# Patient Record
Sex: Female | Born: 1980 | Race: White | Hispanic: No
Health system: Southern US, Community
[De-identification: ages and names within clinical notes are randomized; demographics above are authoritative.]

---

## 2016-07-17 ENCOUNTER — Emergency Department (HOSPITAL_COMMUNITY)
Admission: EM | Admit: 2016-07-17 | Discharge: 2016-07-17 | Disposition: A | Discharge status: Home / Self Care | Payer: Medicaid Other | Attending: Emergency Medicine | Admitting: Emergency Medicine

## 2016-07-17 ENCOUNTER — Emergency Department (HOSPITAL_COMMUNITY): Payer: Medicaid Other

## 2016-07-17 ENCOUNTER — Encounter (HOSPITAL_COMMUNITY): Payer: Self-pay | Admitting: Adult Health

## 2016-07-17 DIAGNOSIS — Y929 Unspecified place or not applicable: Secondary | ICD-10-CM | POA: Diagnosis not present

## 2016-07-17 DIAGNOSIS — Z3A00 Weeks of gestation of pregnancy not specified: Secondary | ICD-10-CM | POA: Diagnosis not present

## 2016-07-17 DIAGNOSIS — Y998 Other external cause status: Secondary | ICD-10-CM | POA: Diagnosis not present

## 2016-07-17 DIAGNOSIS — S99922A Unspecified injury of left foot, initial encounter: Secondary | ICD-10-CM | POA: Diagnosis present

## 2016-07-17 DIAGNOSIS — Y9301 Activity, walking, marching and hiking: Secondary | ICD-10-CM | POA: Insufficient documentation

## 2016-07-17 DIAGNOSIS — F17200 Nicotine dependence, unspecified, uncomplicated: Secondary | ICD-10-CM | POA: Diagnosis not present

## 2016-07-17 DIAGNOSIS — W2203XA Walked into furniture, initial encounter: Secondary | ICD-10-CM | POA: Diagnosis not present

## 2016-07-17 DIAGNOSIS — S92512A Displaced fracture of proximal phalanx of left lesser toe(s), initial encounter for closed fracture: Secondary | ICD-10-CM | POA: Diagnosis not present

## 2016-07-17 MED ORDER — ACETAMINOPHEN 500 MG PO TABS
1000.0000 mg | ORAL_TABLET | Freq: Once | ORAL | Status: AC
  Administered 2016-07-17: 1000 mg via ORAL
  Filled 2016-07-17: qty 2

## 2016-07-17 MED ORDER — LIDOCAINE HCL (PF) 2 % IJ SOLN
2.0000 mL | Freq: Once | INTRAMUSCULAR | Status: AC
  Administered 2016-07-17: 2 mL
  Filled 2016-07-17: qty 10

## 2016-07-17 NOTE — ED Triage Notes (Signed)
Presents with left 5th toe deformity. Occurred this evening while outside in yard, dog got under her and she hit her shin and toe got caught on something. SHe is 3 months pregnant. Denies abdominal pain.

## 2016-07-17 NOTE — Discharge Instructions (Signed)
Wear your post op shoe at all times when awake and walking or standing.  Keep your toe buddy taped to help keep the alignment while this toe heals.  Ice and elevation, tylenol will help with pain.

## 2016-07-18 NOTE — ED Provider Notes (Signed)
[facility]-EMERG[facility]Y DEPT Provider Note   CSN: [id number] Arrival date & time: 07/17/16  2000     History   Chief Complaint Chief Complaint  Patient presents with  . Toe Injury    HPI Amanda Andersen is a 36 y.o. female who is currently 3 months pregnant (to establish care with [facility] in a few weeks) presenting with left 5th toe pain and deformity. She tripped over her small dog (but did not fall) during which time she stubbed her toe on furniture.  The injury occurred just prior to arrival. She also sustained a bruise on her left lower anterior leg, but denies pain at this site.  She has no abdominal pain and no other complaint. She has had no treatment prior to arrival.  The history is provided by the patient and a parent.    History reviewed. No pertinent past medical history.  There are no active problems to display for this patient.   History reviewed. No pertinent surgical history.  OB History    Gravida Para Term Preterm AB Living   1             SAB TAB Ectopic Multiple Live Births                   Home Medications    Prior to Admission medications   Medication Sig Start Date End Date Taking? Authorizing Provider  promethazine (PHENERGAN) 12.5 MG tablet Take 12.5 mg by mouth every 6 (six) hours as needed for nausea or vomiting.   Yes [provider]    Family History History reviewed. No pertinent family history.  Social History Social History  Substance Use Topics  . Smoking status: Current Every Day Smoker  . Smokeless tobacco: Current User  . Alcohol use No     Allergies   Penicillins   Review of Systems Review of Systems  Constitutional: Negative for fever.  Musculoskeletal: Positive for arthralgias and joint swelling. Negative for myalgias.  Skin: Positive for color change. Negative for wound.  Neurological: Negative for weakness, light-headedness and numbness.     Physical Exam Updated Vital Signs BP (!) 96/54 (BP Location:  Right Arm)   Pulse 97   Temp 98.3 F (36.8 C) (Oral)   Resp 20   Ht 5\' 6"  (1.676 m)   LMP  (Approximate)   SpO2 100%   Physical Exam  Constitutional: She appears well-developed and well-nourished.  HENT:  Head: Atraumatic.  Neck: Normal range of motion.  Cardiovascular:  Pulses:      Dorsalis pedis pulses are 2+ on the right side, and 2+ on the left side.  Pulses equal bilaterally  Musculoskeletal: She exhibits tenderness.       Left foot: There is decreased range of motion, bony tenderness and deformity. There is normal capillary refill and no laceration.  Lateral deformity of distal left 5th toe. Bruising noted at toe base.  Distal sensation intact. Bruise left lower anterior pre tib. No hematoma.    Neurological: She is alert. She has normal strength. She displays normal reflexes. No sensory deficit.  Skin: Skin is warm and dry.  Psychiatric: She has a normal mood and affect.     ED Treatments / Results  Labs (all labs ordered are listed, but only abnormal results are displayed) Labs Reviewed - No data to display  EKG  EKG Interpretation None       Radiology Dg Foot Complete Left  Result Date: 07/17/2016 CLINICAL DATA:  Fifth toe  deformity, injury EXAM: LEFT FOOT - COMPLETE 3+ VIEW COMPARISON:  None. FINDINGS: Fracture through the mid to distal shaft of the fifth proximal phalanx. Moderate lateral angulation of distal fracture fragment. No subluxation. IMPRESSION: Angulated fracture involving the mid to distal aspect of the fifth proximal phalanx Electronically Signed   By: Jasmine Pang M.D.   On: 07/17/2016 20:55    Procedures Reduction of fracture Date/Time: 07/17/2016 9:30 PM Performed by: Burgess Amor Authorized by: Burgess Amor  Consent: Verbal consent obtained. Risks and benefits: risks, benefits and alternatives were discussed Consent given by: patient Patient understanding: patient states understanding of the procedure being performed Patient identity  confirmed: verbally with patient Preparation: Patient was prepped and draped in the usual sterile fashion. Local anesthesia used: yes Anesthesia: digital block  Anesthesia: Local anesthesia used: yes Local Anesthetic: lidocaine 2% without epinephrine Anesthetic total: 2 mL  Sedation: Patient sedated: no Patient tolerance: Patient tolerated the procedure well with no immediate complications    (including critical care time)    SPLINT APPLICATION Date/Time: 2:12 PM Authorized by: Burgess Amor Consent: Verbal consent obtained. Risks and benefits: risks, benefits and alternatives were discussed Consent given by: patient Splint applied by: PA Location details: left 5th toe Splint type: buddy tape followed by post op shoe Supplies used: tape, cling, post op shoe. Padded between toes using 2x2's. Post-procedure: The splinted body part was neurovascularly unchanged following the procedure. Patient tolerance: Patient tolerated the procedure well with no immediate complications.     Medications Ordered in ED Medications  acetaminophen (TYLENOL) tablet 1,000 mg (1,000 mg Oral Given 07/17/16 2121)  lidocaine (XYLOCAINE) 2 % injection 2 mL (2 mLs Other Given 07/17/16 2155)     Initial Impression / Assessment and Plan / ED Course  I have reviewed the triage vital signs and the nursing notes.  Pertinent labs & imaging results that were available during my care of the patient were reviewed by me and considered in my medical decision making (see chart for details).     Elevation, ice, tylenol. Referral to ortho for f/u care.  Final Clinical Impressions(s) / ED Diagnoses   Final diagnoses:  Displaced fracture of proximal phalanx of left lesser toe(s), initial encounter for closed fracture    New Prescriptions Discharge Medication List as of 07/17/2016  9:48 PM       Burgess Amor, PA-C 07/18/16 1417    Donnetta Hutching, MD 07/19/16 1701

## 2016-11-16 ENCOUNTER — Emergency Department (HOSPITAL_COMMUNITY)
Admission: EM | Admit: 2016-11-16 | Discharge: 2016-11-16 | Disposition: A | Discharge status: Home / Self Care | Payer: Medicaid Other | Attending: Emergency Medicine | Admitting: Emergency Medicine

## 2016-11-16 ENCOUNTER — Other Ambulatory Visit: Payer: Self-pay

## 2016-11-16 ENCOUNTER — Encounter (HOSPITAL_COMMUNITY): Payer: Self-pay | Admitting: *Deleted

## 2016-11-16 DIAGNOSIS — R1110 Vomiting, unspecified: Secondary | ICD-10-CM | POA: Diagnosis present

## 2016-11-16 DIAGNOSIS — Z5321 Procedure and treatment not carried out due to patient leaving prior to being seen by health care provider: Secondary | ICD-10-CM | POA: Insufficient documentation

## 2016-11-16 NOTE — ED Notes (Signed)
Called for pt and pt no longer in waiting room 

## 2016-11-16 NOTE — ED Triage Notes (Signed)
Pt c/o vomiting x 3 days and diarrhea that started today. Pt is 7 months pregnant. Pt's [facility] [place] Last appt last week. Normal pregnancy so far per pt.

## 2017-04-12 ENCOUNTER — Emergency Department (HOSPITAL_COMMUNITY)
Admission: EM | Admit: 2017-04-12 | Discharge: 2017-04-12 | Disposition: A | Discharge status: Home / Self Care | Payer: Medicaid Other | Attending: Emergency Medicine | Admitting: Emergency Medicine

## 2017-04-12 ENCOUNTER — Encounter (HOSPITAL_COMMUNITY): Payer: Self-pay | Admitting: *Deleted

## 2017-04-12 ENCOUNTER — Other Ambulatory Visit: Payer: Self-pay

## 2017-04-12 DIAGNOSIS — F17210 Nicotine dependence, cigarettes, uncomplicated: Secondary | ICD-10-CM | POA: Insufficient documentation

## 2017-04-12 DIAGNOSIS — R112 Nausea with vomiting, unspecified: Secondary | ICD-10-CM | POA: Insufficient documentation

## 2017-04-12 DIAGNOSIS — F1190 Opioid use, unspecified, uncomplicated: Secondary | ICD-10-CM | POA: Insufficient documentation

## 2017-04-12 LAB — RAPID URINE DRUG SCREEN, HOSP PERFORMED
Amphetamines: NOT DETECTED
Barbiturates: NOT DETECTED
Benzodiazepines: NOT DETECTED
Cocaine: NOT DETECTED
OPIATES: POSITIVE — AB
TETRAHYDROCANNABINOL: NOT DETECTED

## 2017-04-12 LAB — URINALYSIS, ROUTINE W REFLEX MICROSCOPIC
BILIRUBIN URINE: NEGATIVE
Glucose, UA: NEGATIVE mg/dL
KETONES UR: 5 mg/dL — AB
Nitrite: NEGATIVE
PROTEIN: NEGATIVE mg/dL
Specific Gravity, Urine: 1.02 (ref 1.005–1.030)
pH: 5 (ref 5.0–8.0)

## 2017-04-12 LAB — PREGNANCY, URINE: Preg Test, Ur: NEGATIVE

## 2017-04-12 MED ORDER — ONDANSETRON HCL 4 MG PO TABS: 4.0000 mg | ORAL_TABLET | Freq: Three times a day (TID) | ORAL | 0 refills | Status: AC | PRN

## 2017-04-12 MED ORDER — ONDANSETRON 4 MG PO TBDP
4.0000 mg | ORAL_TABLET | Freq: Once | ORAL | Status: AC
  Administered 2017-04-12: 4 mg via ORAL
  Filled 2017-04-12: qty 1

## 2017-04-12 NOTE — Discharge Instructions (Addendum)
Use Zofran as needed for nausea or vomiting. Take Tylenol as needed for pain. Try to make sure you are staying well-hydrated. Eat small, bland meals over the next several days.  Avoid spicy, greasy, fatty, or acidic foods. Try to decrease your use of opioids.  This is likely contributing to your symptoms. Return to the emergency room if you develop persistent vomiting despite medication, high fevers, bloody stools, or any new or concerning symptoms.

## 2017-04-12 NOTE — ED Triage Notes (Signed)
Pt c/o nausea and vomiting since 0700 this morning. Denies abdominal pain, diarrhea, fever.

## 2017-04-12 NOTE — ED Notes (Signed)
PT tolerating po fluids and crackers at this time. 

## 2017-04-12 NOTE — ED Provider Notes (Signed)
[facility] EMERGENCY DEPARTMENT Provider Note   CSN: [id number] Arrival date & time: 04/12/17  [id number]     History   Chief Complaint Chief Complaint  Patient presents with  . Emesis    HPI Glade StanfordCrystal Dearcos is a 37 y.o. female presenting for evaluation of nausea and vomiting.  Patient states that she woke up at 7:00 this morning, and since then has had persistent nausea and vomiting.  She reports pain while vomiting, no pain at rest.  Vomitus is nonbloody nonbilious.  Worse with oral intake.  She has not taken anything for her symptoms.  She denies fevers, chills, chest pain, shortness of breath, abdominal pain, urinary symptoms, abnormal bowel movements (including diarrhea).  No sick contacts.  No history of abdominal problems or surgeries.  No recent travel.  Denies alcohol or drug use.  Smokes cigarettes, 1 pack a day.  States she took a pregnancy test several weeks ago which was negative, no known pregnancy but it is possible.   HPI  History reviewed. No pertinent past medical history.  There are no active problems to display for this patient.   History reviewed. No pertinent surgical history.   OB History    Gravida  1   Para      Term      Preterm      AB      Living        SAB      TAB      Ectopic      Multiple      Live Births               Home Medications    Prior to Admission medications   Medication Sig Start Date End Date Taking? Authorizing Provider  ondansetron (ZOFRAN) 4 MG tablet Take 1 tablet (4 mg total) by mouth every 8 (eight) hours as needed for nausea or vomiting. 04/12/17   Dayvian Blixt, PA-C    Family History No family history on file.  Social History Social History   Tobacco Use  . Smoking status: Current Every Day Smoker    Packs/day: 1.00    Types: Cigarettes  . Smokeless tobacco: Current User  Substance Use Topics  . Alcohol use: No  . Drug use: No     Allergies   Penicillins   Review of Systems Review  of Systems  Constitutional: Negative for chills and fever.  HENT: Negative for congestion.   Respiratory: Negative for shortness of breath.   Cardiovascular: Negative for chest pain.  Gastrointestinal: Positive for nausea and vomiting. Negative for abdominal pain, constipation and diarrhea.  Genitourinary: Negative for dysuria, frequency and hematuria.  Skin: Negative for rash.  Allergic/Immunologic: Negative for immunocompromised state.  Neurological: Negative for headaches.  Hematological: Does not bruise/bleed easily.  Psychiatric/Behavioral: Negative for confusion.     Physical Exam Updated Vital Signs BP 100/60   Pulse 62   Temp 97.9 F (36.6 C)   Resp 18   Ht 5\' 6"  (1.676 m)   Wt 61.2 kg (135 lb)   LMP 03/20/2017   SpO2 98%   BMI 21.79 kg/m   Physical Exam  Constitutional: She is oriented to person, place, and time. She appears well-developed and well-nourished. No distress.  Sitting comfortably in bed in no apparent distress.  HENT:  Head: Normocephalic and atraumatic.  Mouth/Throat: Uvula is midline and oropharynx is clear and moist. Mucous membranes are dry.  Eyes: Pupils are equal, round, and reactive to light. Conjunctivae  and EOM are normal.  Neck: Normal range of motion. Neck supple.  Cardiovascular: Normal rate, regular rhythm and intact distal pulses.  Pulmonary/Chest: Effort normal and breath sounds normal. No respiratory distress. She has no wheezes.  Abdominal: Soft. Bowel sounds are normal. She exhibits no distension and no mass. There is no tenderness. There is no guarding.  No tenderness to palpation of the abdomen.  Abdomen is soft without rigidity, guarding, or distention.  Bowel sounds normoactive x4.  Musculoskeletal: Normal range of motion.  Neurological: She is alert and oriented to person, place, and time.  Skin: Skin is warm and dry.  Psychiatric: She has a normal mood and affect.  Nursing note and vitals reviewed.    ED Treatments /  Results  Labs (all labs ordered are listed, but only abnormal results are displayed) Labs Reviewed  URINALYSIS, ROUTINE W REFLEX MICROSCOPIC - Abnormal; Notable for the following components:      Result Value   APPearance HAZY (*)    Hgb urine dipstick SMALL (*)    Ketones, ur 5 (*)    Leukocytes, UA SMALL (*)    Bacteria, UA RARE (*)    Squamous Epithelial / LPF 6-30 (*)    All other components within normal limits  RAPID URINE DRUG SCREEN, HOSP PERFORMED - Abnormal; Notable for the following components:   Opiates POSITIVE (*)    All other components within normal limits  PREGNANCY, URINE    EKG None  Radiology No results found.  Procedures Procedures (including critical care time)  Medications Ordered in ED Medications  ondansetron (ZOFRAN-ODT) disintegrating tablet 4 mg (4 mg Oral Given 04/12/17 1610)     Initial Impression / Assessment and Plan / ED Course  I have reviewed the triage vital signs and the nursing notes.  Pertinent labs & imaging results that were available during my care of the patient were reviewed by me and considered in my medical decision making (see chart for details).     Patient presenting for evaluation of nausea and vomiting.  Physical examination, she is afebrile not tachycardic.  She appears nontoxic.  She appears mildly dehydrated.  Abdominal exam reassuring, no tenderness to palpation of the abdomen.  Will obtain urine to assess for pregnancy, UDS, uti.  Zofran for symptom control.  Pt reports improvement of sxs with zofran. UDS positive for opiates. UA without sings of UTI or significant dehydration. Pregnancy negative. I do not believe further labs or imaging would be beneficial at this time, doubt surgical abdomen, obstruction, perforation, or bacterial infection. PO challenge without difficulty. Will d/c with zofran and instructions to stop using opioids. At this time, pt appears safe for d/c. Return precautions given. Pt states she  understands and agrees to plan.    Final Clinical Impressions(s) / ED Diagnoses   Final diagnoses:  Non-intractable vomiting with nausea, unspecified vomiting type    ED Discharge Orders        Ordered    ondansetron (ZOFRAN) 4 MG tablet  Every 8 hours PRN     04/12/17 1022       Helia Haese, PA-C 04/14/17 1651    Vanetta Mulders, MD 04/21/17 [phone]

## 2018-05-16 ENCOUNTER — Emergency Department (HOSPITAL_COMMUNITY)
Admission: EM | Admit: 2018-05-16 | Discharge: 2018-05-16 | Disposition: A | Discharge status: Home / Self Care | Payer: Medicaid - Out of State | Attending: Emergency Medicine | Admitting: Emergency Medicine

## 2018-05-16 ENCOUNTER — Emergency Department (HOSPITAL_COMMUNITY): Payer: Medicaid - Out of State

## 2018-05-16 ENCOUNTER — Other Ambulatory Visit: Payer: Self-pay

## 2018-05-16 ENCOUNTER — Encounter (HOSPITAL_COMMUNITY): Payer: Self-pay | Admitting: *Deleted

## 2018-05-16 DIAGNOSIS — Z79899 Other long term (current) drug therapy: Secondary | ICD-10-CM | POA: Insufficient documentation

## 2018-05-16 DIAGNOSIS — O219 Vomiting of pregnancy, unspecified: Secondary | ICD-10-CM

## 2018-05-16 DIAGNOSIS — F17210 Nicotine dependence, cigarettes, uncomplicated: Secondary | ICD-10-CM | POA: Insufficient documentation

## 2018-05-16 DIAGNOSIS — R05 Cough: Secondary | ICD-10-CM | POA: Diagnosis not present

## 2018-05-16 DIAGNOSIS — I951 Orthostatic hypotension: Secondary | ICD-10-CM | POA: Insufficient documentation

## 2018-05-16 DIAGNOSIS — Z3A21 21 weeks gestation of pregnancy: Secondary | ICD-10-CM | POA: Diagnosis not present

## 2018-05-16 LAB — URINALYSIS, ROUTINE W REFLEX MICROSCOPIC
Bilirubin Urine: NEGATIVE
Glucose, UA: NEGATIVE mg/dL
Hgb urine dipstick: NEGATIVE
Ketones, ur: NEGATIVE mg/dL
Leukocytes,Ua: NEGATIVE
Nitrite: NEGATIVE
Protein, ur: NEGATIVE mg/dL
Specific Gravity, Urine: 1.013 (ref 1.005–1.030)
pH: 8 (ref 5.0–8.0)

## 2018-05-16 LAB — CBC
HCT: 39.1 % (ref 36.0–46.0)
Hemoglobin: 12.6 g/dL (ref 12.0–15.0)
MCH: 30.5 pg (ref 26.0–34.0)
MCHC: 32.2 g/dL (ref 30.0–36.0)
MCV: 94.7 fL (ref 80.0–100.0)
Platelets: 182 10*3/uL (ref 150–400)
RBC: 4.13 MIL/uL (ref 3.87–5.11)
RDW: 14.4 % (ref 11.5–15.5)
WBC: 8.4 10*3/uL (ref 4.0–10.5)
nRBC: 0 % (ref 0.0–0.2)

## 2018-05-16 LAB — BASIC METABOLIC PANEL
Anion gap: 7 (ref 5–15)
BUN: 11 mg/dL (ref 6–20)
CO2: 24 mmol/L (ref 22–32)
Calcium: 8.3 mg/dL — ABNORMAL LOW (ref 8.9–10.3)
Chloride: 104 mmol/L (ref 98–111)
Creatinine, Ser: 0.48 mg/dL (ref 0.44–1.00)
GFR calc Af Amer: >60 mL/min (ref >60)
GFR calc non Af Amer: >60 mL/min (ref >60)
Glucose, Bld: 79 mg/dL (ref 70–99)
Potassium: 4 mmol/L (ref 3.5–5.1)
Sodium: 135 mmol/L (ref 135–145)

## 2018-05-16 LAB — LACTIC ACID, PLASMA: Lactic Acid, Venous: 0.9 mmol/L (ref 0.5–1.9)

## 2018-05-16 MED ORDER — SODIUM CHLORIDE 0.9 % IV BOLUS
1000.0000 mL | Freq: Once | INTRAVENOUS | Status: AC
  Administered 2018-05-16: 1000 mL via INTRAVENOUS

## 2018-05-16 MED ORDER — SODIUM CHLORIDE 0.9 % IV BOLUS
1000.0000 mL | Freq: Once | INTRAVENOUS | Status: AC
  Administered 2018-05-16: 10:00:00 1000 mL via INTRAVENOUS

## 2018-05-16 MED ORDER — ONDANSETRON 4 MG PO TBDP: ORAL_TABLET | ORAL | 0 refills | Status: AC

## 2018-05-16 MED ORDER — ONDANSETRON HCL 4 MG/2ML IJ SOLN
4.0000 mg | Freq: Once | INTRAMUSCULAR | Status: AC
  Administered 2018-05-16: 10:00:00 4 mg via INTRAVENOUS
  Filled 2018-05-16: qty 2

## 2018-05-16 MED ORDER — SODIUM CHLORIDE 0.9 % IV BOLUS
1000.0000 mL | Freq: Once | INTRAVENOUS | Status: AC
  Administered 2018-05-16: 12:00:00 1000 mL via INTRAVENOUS

## 2018-05-16 NOTE — ED Triage Notes (Signed)
Patient is [redacted] weeks pregnant, has been nauseated with vomiting for the last 3 days.  Patient has used phenergen with no relief. Patient also states she has productive cough for about 3 weeks with no fever.

## 2018-05-16 NOTE — Discharge Instructions (Addendum)
Call your OB for recheck. Stay well hydrated. Take zofran as needed for vomiting. Return for fevers, bleeding, abdominal pain or new concerns.

## 2018-05-16 NOTE — ED Provider Notes (Signed)
[facility] EMERGENCY DEPARTMENT Provider Note   CSN: [id number] Arrival date & time: 05/16/18  7829    History   Chief Complaint Chief Complaint  Patient presents with  . Emesis    HPI Amanda Andersen is a 38 y.o. female.     Patient presents with recurrent vomiting for almost 3 days and productive cough for over 2 weeks.  No significant sick contacts or patients with CO VID.  Patient denies any fevers or chills.  Patient is currently approximately [redacted] weeks pregnant and has follow-up with OB and has had a formal ultrasound which per her report was okay.  Patient denies any vaginal symptoms or abdominal pain.  Patient has recurrent nausea.  This is her second pregnancy, no issues with first pregnancy.  No history of abnormal pregnancies.     History reviewed. No pertinent past medical history.  There are no active problems to display for this patient.   History reviewed. No pertinent surgical history.   OB History    Gravida  1   Para      Term      Preterm      AB      Living        SAB      TAB      Ectopic      Multiple      Live Births               Home Medications    Prior to Admission medications   Medication Sig Start Date End Date Taking? Authorizing Provider  Prenatal Vit-Fe Fumarate-FA (PREPLUS) 27-1 MG TABS Take 1 tablet by mouth daily. 02/25/18  Yes [provider]  promethazine (PHENERGAN) 25 MG tablet Take 25 mg by mouth every 6 (six) hours as needed for nausea or vomiting.   Yes [provider]  ondansetron (ZOFRAN ODT) 4 MG disintegrating tablet  ODT q4 hours prn nausea/vomit 05/16/18   Blane Ohara, MD  ondansetron (ZOFRAN) 4 MG tablet Take 1 tablet (4 mg total) by mouth every 8 (eight) hours as needed for nausea or vomiting. Patient not taking: Reported on 05/16/2018 04/12/17   Alveria Apley, PA-C    Family History History reviewed. No pertinent family history.  Social History Social History   Tobacco  Use  . Smoking status: Current Every Day Smoker    Packs/day: 1.00    Types: Cigarettes  . Smokeless tobacco: Current User  Substance Use Topics  . Alcohol use: No  . Drug use: No     Allergies   Penicillins   Review of Systems Review of Systems  Constitutional: Negative for chills and fever.  HENT: Negative for congestion.   Eyes: Negative for visual disturbance.  Respiratory: Positive for cough. Negative for shortness of breath.   Cardiovascular: Negative for chest pain.  Gastrointestinal: Positive for nausea and vomiting. Negative for abdominal pain.  Genitourinary: Negative for dysuria and flank pain.  Musculoskeletal: Negative for back pain, neck pain and neck stiffness.  Skin: Negative for rash.  Neurological: Negative for light-headedness and headaches.     Physical Exam Updated Vital Signs BP 101/63   Pulse 89   Temp 98.1 F (36.7 C) (Oral)   Resp 18   Ht  (1.676 m)   Wt 63.5 kg   SpO2 99%   BMI 22.60 kg/m   Physical Exam Vitals signs and nursing note reviewed.  Constitutional:      Appearance: She is well-developed.  HENT:  Head: Normocephalic and atraumatic.     Comments: Dry mucous membranes Eyes:     General:        Right eye: No discharge.        Left eye: No discharge.     Conjunctiva/sclera: Conjunctivae normal.  Neck:     Musculoskeletal: Normal range of motion and neck supple.     Trachea: No tracheal deviation.  Cardiovascular:     Rate and Rhythm: Normal rate and regular rhythm.  Pulmonary:     Effort: Pulmonary effort is normal.     Breath sounds: Normal breath sounds.  Abdominal:     General: There is no distension.     Palpations: Abdomen is soft.     Tenderness: There is no abdominal tenderness. There is no guarding.  Skin:    General: Skin is warm.     Findings: No rash.  Neurological:     Mental Status: She is alert and oriented to person, place, and time.      ED Treatments / Results  Labs (all labs  ordered are listed, but only abnormal results are displayed) Labs Reviewed  BASIC METABOLIC PANEL - Abnormal; Notable for the following components:      Result Value   Calcium 8.3 (*)    All other components within normal limits  URINE CULTURE  SARS CORONAVIRUS 2 (HOSPITAL ORDER, PERFORMED IN [facility])  URINALYSIS, ROUTINE W REFLEX MICROSCOPIC  CBC  LACTIC ACID, PLASMA  LACTIC ACID, PLASMA    EKG None  Radiology Dg Chest Portable 1 View  Result Date: 05/16/2018 CLINICAL DATA:  Cough and chest congestion over the last 2 weeks. Five months pregnant. EXAM: PORTABLE CHEST 1 VIEW COMPARISON:  None. FINDINGS: The heart size and mediastinal contours are within normal limits. Both lungs are clear. The visualized skeletal structures are unremarkable. IMPRESSION: No active disease. Electronically Signed   By: Paulina FusiMark  Shogry M.D.   On: 05/16/2018 11:53    Procedures Ultrasound ED OB Pelvic Date/Time: 05/16/2018 9:53 AM Performed by: Blane OharaZavitz, Giavana Rooke, MD Authorized by: Blane OharaZavitz, Javel Hersh, MD   Procedure details:    Indications: evaluate for IUP and pregnant with abdominal pain     Assess:  Intrauterine pregnancy and fetal viability   Technique:  Transabdominal obstetric (HCG+) exam   Images: archived   Study Limitations: bowel gas Uterine findings:    Intrauterine pregnancy: identified     Single gestation: identified     Fetal pole: identified     Fetal heart rate: identified     Estimated gestational age: 3321 weeks   (including critical care time)  Medications Ordered in ED Medications  sodium chloride 0.9 % bolus 1,000 mL (0 mLs Intravenous Stopped 05/16/18 1103)  ondansetron (ZOFRAN) injection 4 mg (4 mg Intravenous Given 05/16/18 0957)  sodium chloride 0.9 % bolus 1,000 mL (0 mLs Intravenous Stopped 05/16/18 1243)  sodium chloride 0.9 % bolus 1,000 mL (1,000 mLs Intravenous New Bag/Given 05/16/18 1243)     Initial Impression / Assessment and Plan / ED Course  I have  reviewed the triage vital signs and the nursing notes.  Pertinent labs & imaging results that were available during my care of the patient were reviewed by me and considered in my medical decision making (see chart for details).       Patient presents with recurrent vomiting and currently pregnant.  Bedside ultrasound performed showing live intrauterine pregnancy with normal heart rate and normal fetal movement.  No focal abdominal tenderness  on exam plan for IV fluids, screening electrolytes and urinalysis.  Patient has outpatient follow-up with OB.  Patient is also had recurrent productive cough and with patient being pregnant CO VID test ordered however patient refused it.  Patient is stable from a respiratory standpoint not requiring oxygen, no fever, the test was ordered primarily to help with follow-up given her high risk status.  Urinalysis reviewed. Zofran given for nausea and fluid bolus.   Patient's blood pressure low on reassessment.  Patient has no symptoms.  Patient given 2 L bolus fluids, CBC added and reviewed no acute findings normal hemoglobin.  Patient did improve and asking to go home.  Tolerated oral fluids and food.  Blood pressure normal on discharge. Results and differential diagnosis were discussed with the patient/parent/guardian. Xrays were independently reviewed by myself.  Close follow up outpatient was discussed, comfortable with the plan.   Medications  sodium chloride 0.9 % bolus 1,000 mL (0 mLs Intravenous Stopped 05/16/18 1103)  ondansetron (ZOFRAN) injection 4 mg (4 mg Intravenous Given 05/16/18 0957)  sodium chloride 0.9 % bolus 1,000 mL (0 mLs Intravenous Stopped 05/16/18 1243)  sodium chloride 0.9 % bolus 1,000 mL (1,000 mLs Intravenous New Bag/Given 05/16/18 1243)    Vitals:   05/16/18 1030 05/16/18 1100 05/16/18 1220 05/16/18 1230  BP:  (!) 82/40 (!) 83/49 101/63  Pulse: 81 81 79 89  Resp:  12 18   Temp:   98.1 F (36.7 C)   TempSrc:   Oral   SpO2: 98% 97%  94% 99%  Weight:      Height:        Final diagnoses:  Vomiting pregnancy  Orthostatic hypotension     Final Clinical Impressions(s) / ED Diagnoses   Final diagnoses:  Vomiting pregnancy  Orthostatic hypotension    ED Discharge Orders         Ordered    ondansetron (ZOFRAN ODT) 4 MG disintegrating tablet     05/16/18 1430           Blane Ohara, MD 05/16/18 1431

## 2018-05-16 NOTE — ED Notes (Signed)
Patient refused COVID-19 swab. Patient pulled swab out of her nose before swab could be done. Patient stated, "Get the shit out of my nose. That shit hurt. You aren't sticking that up my nose."

## 2018-05-17 LAB — URINE CULTURE: Culture: <10000 — AB

## 2018-05-18 ENCOUNTER — Telehealth: Payer: Self-pay | Admitting: Emergency Medicine

## 2018-05-18 NOTE — Telephone Encounter (Signed)
Post ED Visit - Positive Culture Follow-up  Culture report reviewed by antimicrobial stewardship pharmacist: [facility] []  Enzo Bi, Pharm.D. []  Celedonio Miyamoto, Pharm.D., BCPS AQ-ID []  Garvin Fila, Pharm.D., BCPS []  Georgina Pillion, [address].D., BCPS []  [place], [place].D., BCPS, AAHIVP []  Estella Husk, Pharm.D., BCPS, AAHIVP []  Lysle Pearl, PharmD, BCPS []  Phillips Climes, PharmD, BCPS []  Agapito Games, PharmD, BCPS [x]  Babs Bertin, PharmD []  Mervyn Gay, PharmD, BCPS []  Vinnie Level, PharmD  [facility] []  Len Childs, PharmD []  Greer Pickerel, PharmD []  Adalberto Cole, PharmD []  Perlie Gold, Rph []  Lonell Face) Jean Rosenthal, PharmD []  Earl Many, PharmD []  Junita Push, PharmD []  Dorna Leitz, PharmD []  Terrilee Files, PharmD []  Lynann Beaver, PharmD []  Keturah Barre, PharmD []  Loralee Pacas, PharmD []  Bernadene Person, PharmD   Positive group B strep culture No further patient follow-up is required at this time.  Amanda Andersen 05/18/2018, 3:12 PM

## 2018-10-23 IMAGING — DX DG FOOT COMPLETE 3+V*L*
3 series · 3 of 3 positions shown · non-contrast
Comparison: None.

CLINICAL DATA: Fifth toe deformity, injury

EXAM:
LEFT FOOT - COMPLETE 3+ VIEW

[foot ap]
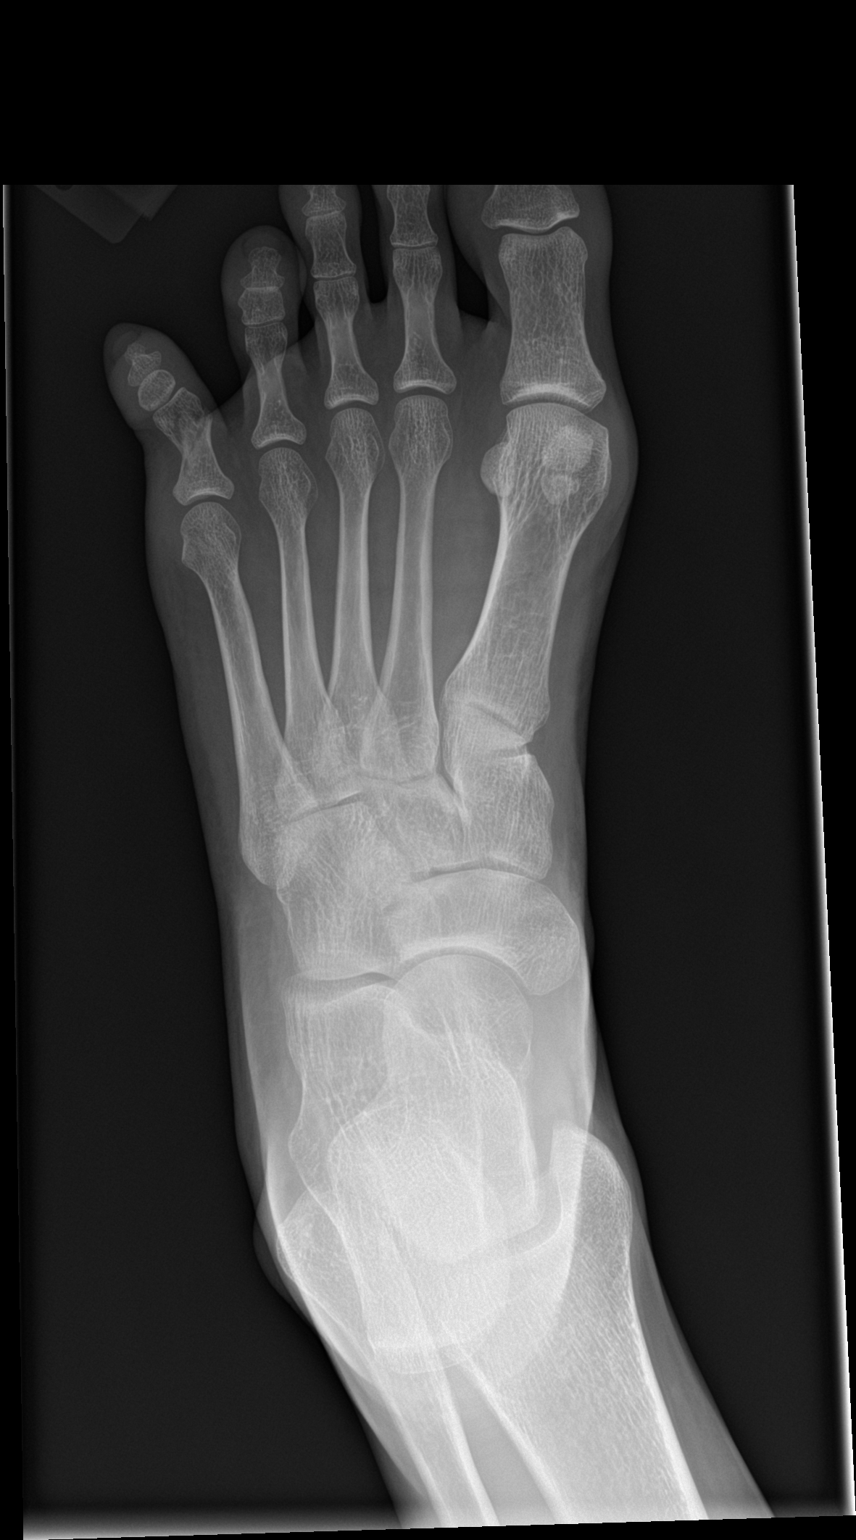

[foot obl]
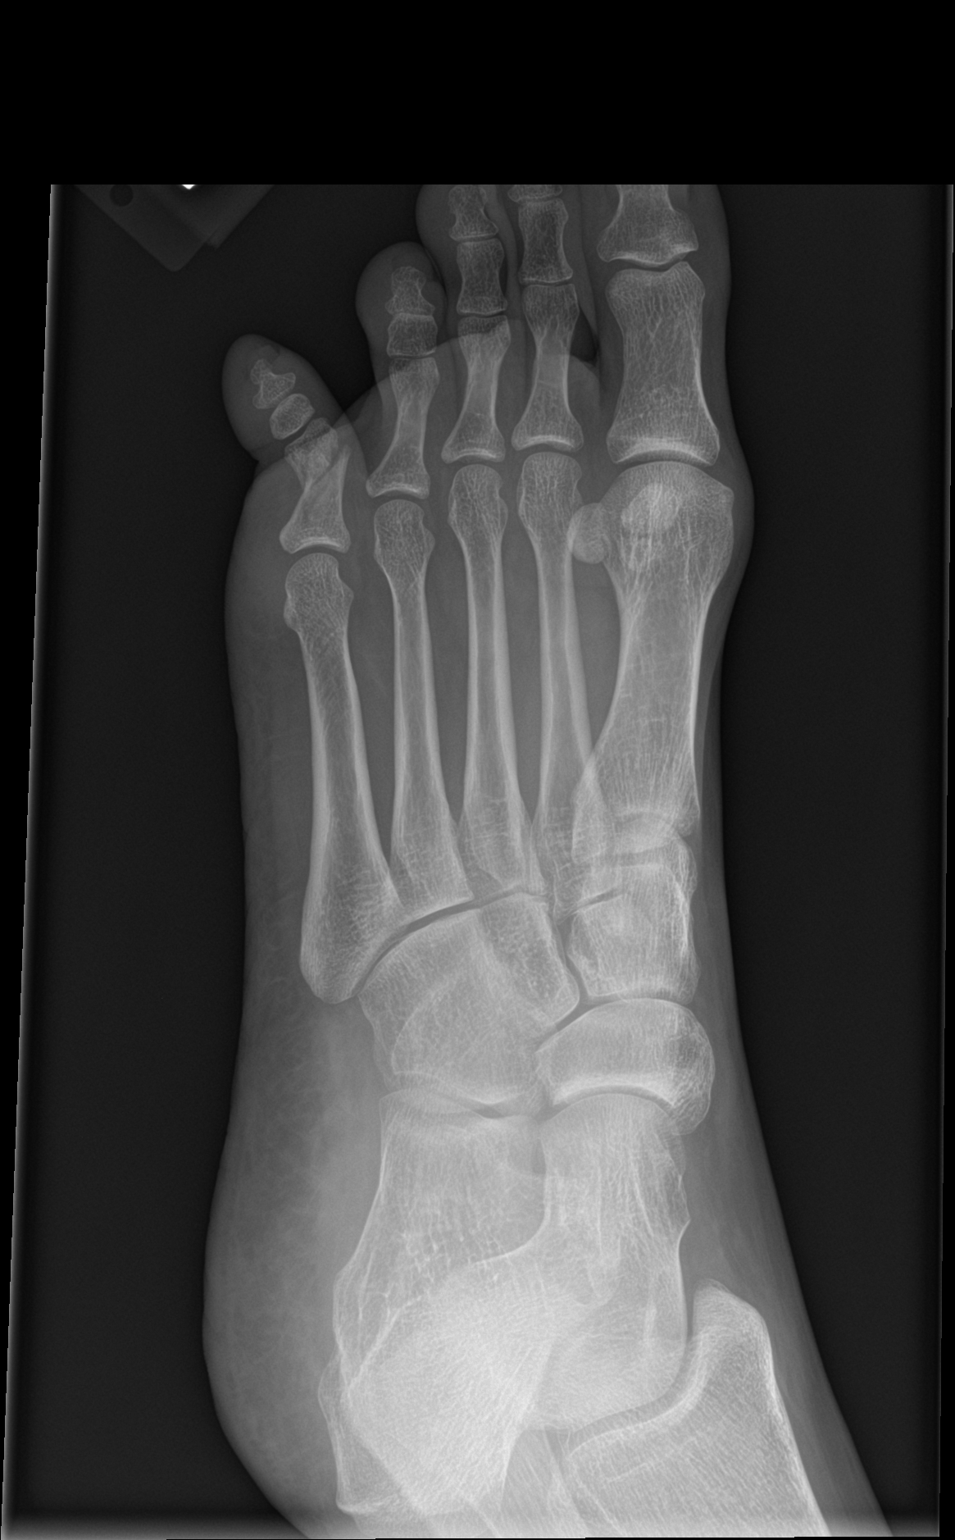

[foot lat]
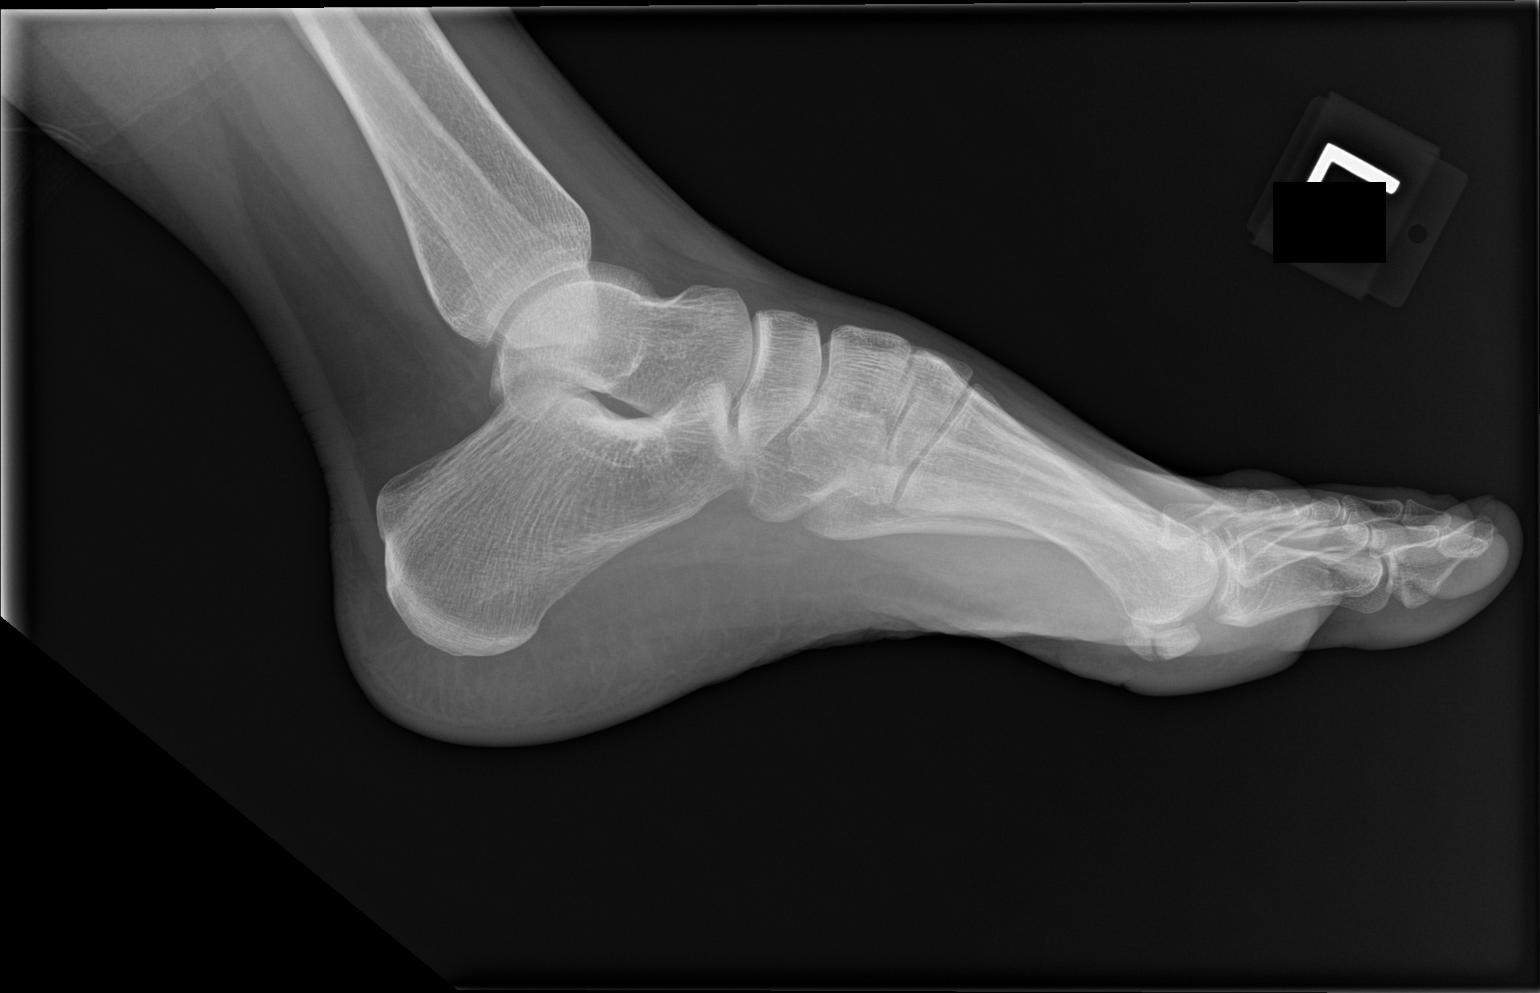

[3 of 3 positions shown; findings below may reference images not displayed]

FINDINGS: Fracture through the mid to distal shaft of the fifth proximal
phalanx. Moderate lateral angulation of distal fracture fragment. No
subluxation.
IMPRESSION: Angulated fracture involving the mid to distal aspect of the fifth
proximal phalanx

## 2019-03-10 ENCOUNTER — Ambulatory Visit: Payer: Medicaid - Out of State | Attending: Internal Medicine

## 2019-03-10 DIAGNOSIS — Z20822 Contact with and (suspected) exposure to covid-19: Secondary | ICD-10-CM | POA: Insufficient documentation

## 2019-03-12 LAB — NOVEL CORONAVIRUS, NAA: SARS-CoV-2, NAA: NOT DETECTED

## 2020-08-21 IMAGING — CR PORTABLE CHEST - 1 VIEW
1 series · 1 of 1 positions shown · non-contrast
Comparison: None.

CLINICAL DATA: Cough and chest congestion over the last 2 weeks.
Five months pregnant.

EXAM:
PORTABLE CHEST 1 VIEW

[portable]
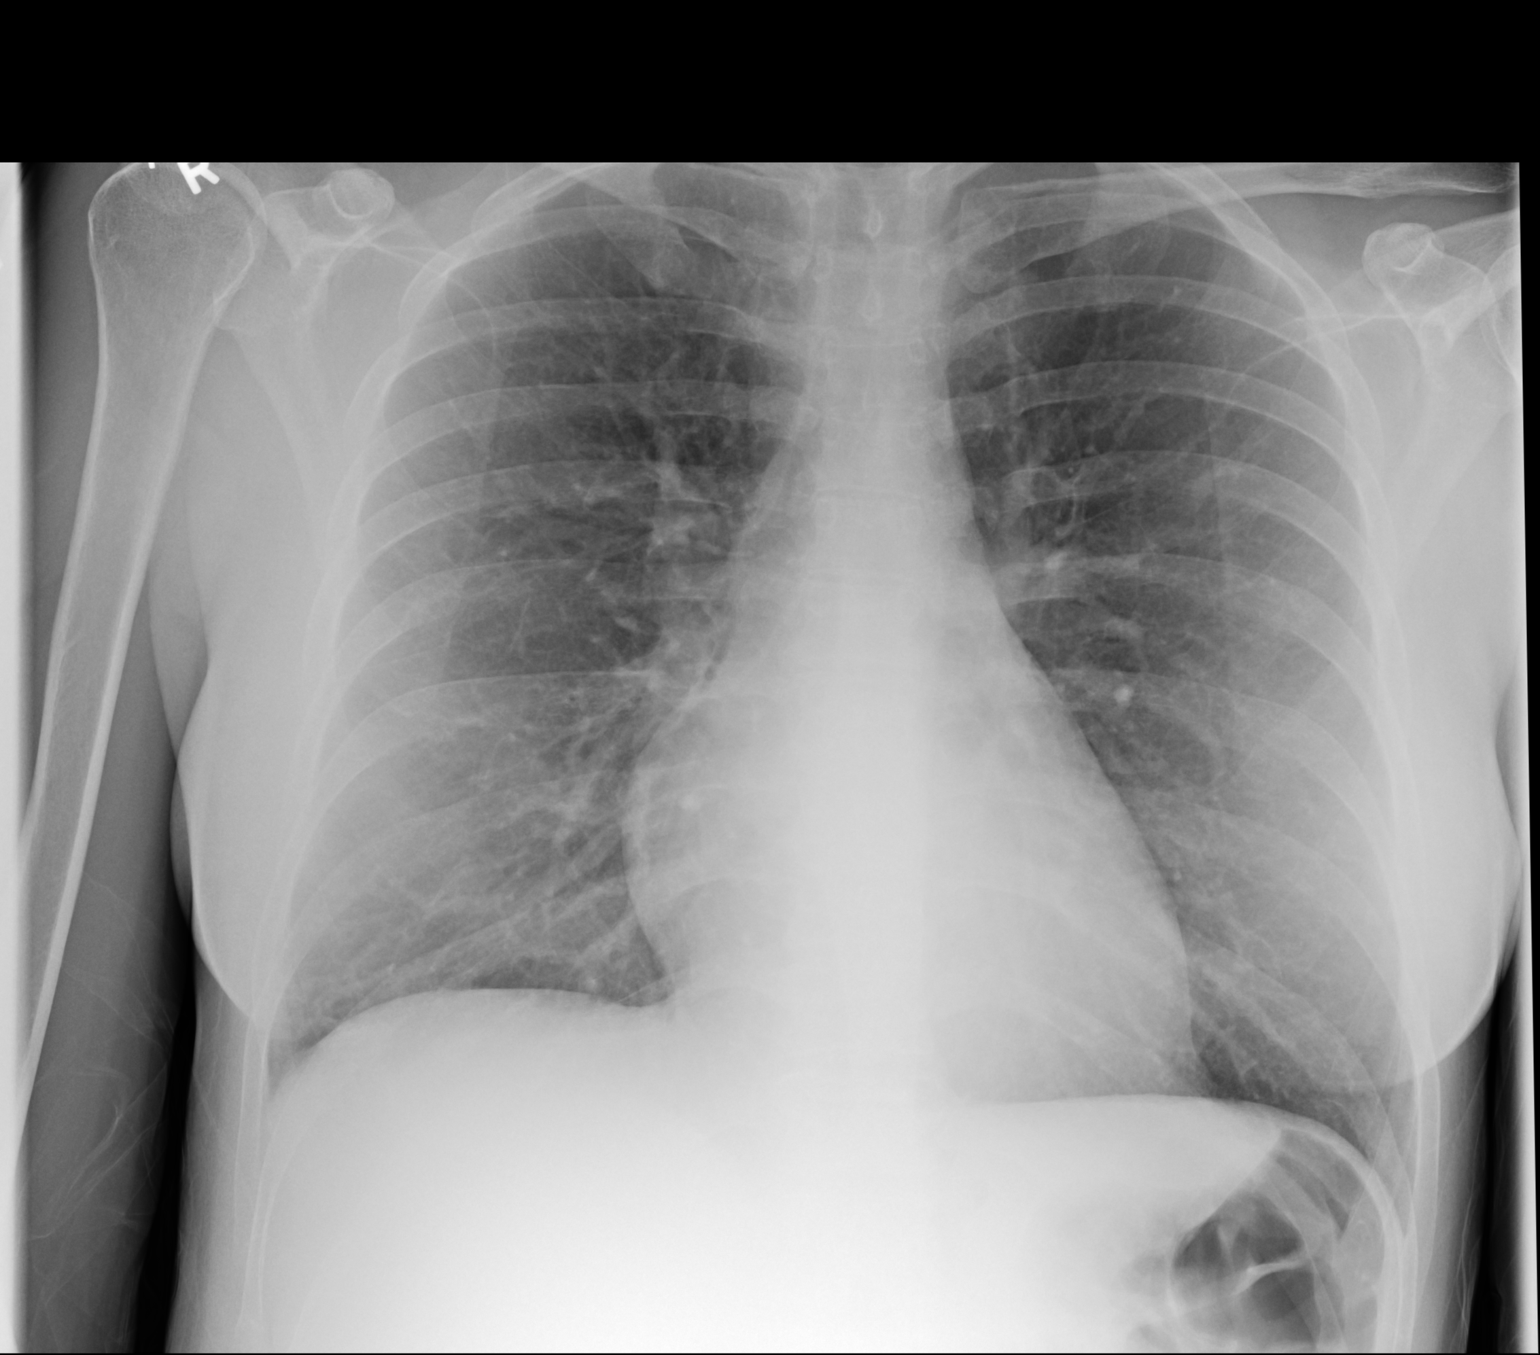

[1 of 1 positions shown; findings below may reference images not displayed]

FINDINGS: The heart size and mediastinal contours are within normal limits.
Both lungs are clear. The visualized skeletal structures are
unremarkable.
IMPRESSION: No active disease.
